# Patient Record
Sex: Male | Born: 1995 | Race: Black or African American | Hispanic: No | Marital: Single | Smoking: Former smoker
Health system: Southern US, Community
[De-identification: ages and names within clinical notes are randomized; demographics above are authoritative.]

## PROBLEM LIST (undated history)

## (undated) DIAGNOSIS — I319 Disease of pericardium, unspecified: Secondary | ICD-10-CM

## (undated) DIAGNOSIS — I10 Essential (primary) hypertension: Secondary | ICD-10-CM

## (undated) HISTORY — PX: OTHER SURGICAL HISTORY: SHX169

## (undated) HISTORY — DX: Essential (primary) hypertension: I10

## (undated) HISTORY — DX: Disease of pericardium, unspecified: I31.9

---
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
## 2013-03-18 HISTORY — PX: WISDOM TOOTH EXTRACTION: SHX21

## 2015-03-06 ENCOUNTER — Emergency Department (HOSPITAL_COMMUNITY)

## 2015-03-06 ENCOUNTER — Emergency Department (HOSPITAL_COMMUNITY)
Admission: EM | Admit: 2015-03-06 | Discharge: 2015-03-07 | Disposition: A | Discharge status: Home / Self Care | Attending: Emergency Medicine | Admitting: Emergency Medicine

## 2015-03-06 ENCOUNTER — Encounter (HOSPITAL_COMMUNITY): Payer: Self-pay | Admitting: Emergency Medicine

## 2015-03-06 DIAGNOSIS — I10 Essential (primary) hypertension: Secondary | ICD-10-CM | POA: Insufficient documentation

## 2015-03-06 DIAGNOSIS — Z87891 Personal history of nicotine dependence: Secondary | ICD-10-CM | POA: Diagnosis not present

## 2015-03-06 DIAGNOSIS — R079 Chest pain, unspecified: Secondary | ICD-10-CM | POA: Diagnosis present

## 2015-03-06 DIAGNOSIS — R0789 Other chest pain: Secondary | ICD-10-CM

## 2015-03-06 LAB — COMPREHENSIVE METABOLIC PANEL
ALT: 36 U/L (ref 17–63)
AST: 29 U/L (ref 15–41)
Albumin: 4.7 g/dL (ref 3.5–5.0)
Alkaline Phosphatase: 73 U/L (ref 38–126)
Anion gap: 11 (ref 5–15)
BUN: 15 mg/dL (ref 6–20)
CO2: 23 mmol/L (ref 22–32)
Calcium: 9.3 mg/dL (ref 8.9–10.3)
Chloride: 105 mmol/L (ref 101–111)
Creatinine, Ser: 1.21 mg/dL (ref 0.61–1.24)
GFR calc Af Amer: >60 mL/min (ref >60)
GFR calc non Af Amer: >60 mL/min (ref >60)
Glucose, Bld: 115 mg/dL — ABNORMAL HIGH (ref 65–99)
Potassium: 3.9 mmol/L (ref 3.5–5.1)
Sodium: 139 mmol/L (ref 135–145)
Total Bilirubin: 0.5 mg/dL (ref 0.3–1.2)
Total Protein: 7.5 g/dL (ref 6.5–8.1)

## 2015-03-06 LAB — CBC
HCT: 43.1 % (ref 39.0–52.0)
Hemoglobin: 14.2 g/dL (ref 13.0–17.0)
MCH: 27.2 pg (ref 26.0–34.0)
MCHC: 32.9 g/dL (ref 30.0–36.0)
MCV: 82.4 fL (ref 78.0–100.0)
Platelets: 257 10*3/uL (ref 150–400)
RBC: 5.23 MIL/uL (ref 4.22–5.81)
RDW: 13 % (ref 11.5–15.5)
WBC: 7.3 10*3/uL (ref 4.0–10.5)

## 2015-03-06 LAB — I-STAT TROPONIN, ED: Troponin i, poc: 0 ng/mL (ref 0.00–0.08)

## 2015-03-06 MED ORDER — ASPIRIN 81 MG PO CHEW
324.0000 mg | CHEWABLE_TABLET | Freq: Once | ORAL | Status: AC
  Administered 2015-03-07: 324 mg via ORAL
  Filled 2015-03-06: qty 4

## 2015-03-06 NOTE — ED Notes (Signed)
Patient is complaining of chest Pain. Patient has had it since Sunday. Patient states it is coming and going.

## 2015-03-07 LAB — I-STAT TROPONIN, ED: Troponin i, poc: 0 ng/mL (ref 0.00–0.08)

## 2015-03-07 NOTE — Discharge Instructions (Signed)
We saw you in the ER for the chest pain/shortness of breath. All of our cardiac workup is normal, including labs, EKG and chest X-RAY are normal. We are not sure what is causing your discomfort, but we feel comfortable sending you home at this time. The workup in the ER is not complete, and you should follow up with your primary care doctor for further evaluation.     Nonspecific Chest Pain  Chest pain can be caused by many different conditions. There is always a chance that your pain could be related to something serious, such as a heart attack or a blood clot in your lungs. Chest pain can also be caused by conditions that are not life-threatening. If you have chest pain, it is very important to follow up with your health care provider. CAUSES  Chest pain can be caused by:  Heartburn.  Pneumonia or bronchitis.  Anxiety or stress.  Inflammation around your heart (pericarditis) or lung (pleuritis or pleurisy).  A blood clot in your lung.  A collapsed lung (pneumothorax). It can develop suddenly on its own (spontaneous pneumothorax) or from trauma to the chest.  Shingles infection (varicella-zoster virus).  Heart attack.  Damage to the bones, muscles, and cartilage that make up your chest wall. This can include:  Bruised bones due to injury.  Strained muscles or cartilage due to frequent or repeated coughing or overwork.  Fracture to one or more ribs.  Sore cartilage due to inflammation (costochondritis). RISK FACTORS  Risk factors for chest pain may include:  Activities that increase your risk for trauma or injury to your chest.  Respiratory infections or conditions that cause frequent coughing.  Medical conditions or overeating that can cause heartburn.  Heart disease or family history of heart disease.  Conditions or health behaviors that increase your risk of developing a blood clot.  Having had chicken pox (varicella zoster). SIGNS AND SYMPTOMS Chest pain can  feel like:  Burning or tingling on the surface of your chest or deep in your chest.  Crushing, pressure, aching, or squeezing pain.  Dull or sharp pain that is worse when you move, cough, or take a deep breath.  Pain that is also felt in your back, neck, shoulder, or arm, or pain that spreads to any of these areas. Your chest pain may come and go, or it may stay constant. DIAGNOSIS Lab tests or other studies may be needed to find the cause of your pain. Your health care provider may have you take a test called an ambulatory ECG (electrocardiogram). An ECG records your heartbeat patterns at the time the test is performed. You may also have other tests, such as:  Transthoracic echocardiogram (TTE). During echocardiography, sound waves are used to create a picture of all of the heart structures and to look at how blood flows through your heart.  Transesophageal echocardiogram (TEE).This is a more advanced imaging test that obtains images from inside your body. It allows your health care provider to see your heart in finer detail.  Cardiac monitoring. This allows your health care provider to monitor your heart rate and rhythm in real time.  Holter monitor. This is a portable device that records your heartbeat and can help to diagnose abnormal heartbeats. It allows your health care provider to track your heart activity for several days, if needed.  Stress tests. These can be done through exercise or by taking medicine that makes your heart beat more quickly.  Blood tests.  Imaging tests. TREATMENT  Your treatment depends on what is causing your chest pain. Treatment may include:  Medicines. These may include:  Acid blockers for heartburn.  Anti-inflammatory medicine.  Pain medicine for inflammatory conditions.  Antibiotic medicine, if an infection is present.  Medicines to dissolve blood clots.  Medicines to treat coronary artery disease.  Supportive care for conditions that do  not require medicines. This may include:  Resting.  Applying heat or cold packs to injured areas.  Limiting activities until pain decreases. HOME CARE INSTRUCTIONS  If you were prescribed an antibiotic medicine, finish it all even if you start to feel better.  Avoid any activities that bring on chest pain.  Do not use any tobacco products, including cigarettes, chewing tobacco, or electronic cigarettes. If you need help quitting, ask your health care provider.  Do not drink alcohol.  Take medicines only as directed by your health care provider.  Keep all follow-up visits as directed by your health care provider. This is important. This includes any further testing if your chest pain does not go away.  If heartburn is the cause for your chest pain, you may be told to keep your head raised (elevated) while sleeping. This reduces the chance that acid will go from your stomach into your esophagus.  Make lifestyle changes as directed by your health care provider. These may include:  Getting regular exercise. Ask your health care provider to suggest some activities that are safe for you.  Eating a heart-healthy diet. A registered dietitian can help you to learn healthy eating options.  Maintaining a healthy weight.  Managing diabetes, if necessary.  Reducing stress. SEEK MEDICAL CARE IF:  Your chest pain does not go away after treatment.  You have a rash with blisters on your chest.  You have a fever. SEEK IMMEDIATE MEDICAL CARE IF:   Your chest pain is worse.  You have an increasing cough, or you cough up blood.  You have severe abdominal pain.  You have severe weakness.  You faint.  You have chills.  You have sudden, unexplained chest discomfort.  You have sudden, unexplained discomfort in your arms, back, neck, or jaw.  You have shortness of breath at any time.  You suddenly start to sweat, or your skin gets clammy.  You feel nauseous or you vomit.  You  suddenly feel light-headed or dizzy.  Your heart begins to beat quickly, or it feels like it is skipping beats. These symptoms may represent a serious problem that is an emergency. Do not wait to see if the symptoms will go away. Get medical help right away. Call your local emergency services (911 in the [place] Do not drive yourself to the hospital.   This information is not intended to replace advice given to you by your health care provider. Make sure you discuss any questions you have with your health care provider.   Document Released: 10/14/2004 Document Revised: 01/25/2014 Document Reviewed: 08/10/2013 Elsevier Interactive Patient Education 2016 [employer]    Pericarditis Pericarditis is swelling (inflammation) of the pericardium. The pericardium is a thin, double-layered, fluid-filled tissue sac that surrounds the heart. The purpose of the pericardium is to contain the heart in the chest cavity and keep the heart from overexpanding. Different types of pericarditis can occur, such as:  Acute pericarditis. Inflammation can develop suddenly in acute pericarditis.  Chronic pericarditis. Inflammation develops gradually and is long-lasting in chronic pericarditis.  Constrictive pericarditis. In this type of pericarditis, the layers of the pericardium stiffen and  develop scar tissue. The scar tissue thickens and sticks together. This makes it difficult for the heart to pump and work as it normally does. CAUSES  Pericarditis can be caused from different conditions, such as:  A bacterial, fungal or viral infection.  After a heart attack (myocardial infarction).  After open-heart surgery (coronary bypass graft surgery).  Auto-immune conditions such as lupus, rheumatoid arthritis or scleroderma.  Kidney failure.  Low thyroid condition (hypothyroidism).  Cancer from another part of the body that has spread (metastasized) to the pericardium.  Chest injury or trauma.  After  radiation treatment.  Certain medicines. SYMPTOMS  Symptoms of pericarditis can include:  Chest pain. Chest pain symptoms may increase when laying down and may be relieved when sitting up and leaning forward.  A chronic, dry cough.  Heart palpitations. These may feel like rapid, fluttering or pounding heart beats.  Chest pain may be worse when swallowing.  Dizziness or fainting.  Tiredness, fatigue or lethargy.  Fever. DIAGNOSIS  Pericarditis is diagnosed by the following:  A physical exam. A heart sound called a pericardial friction rub may be heard when your caregiver listens to your heart.  Blood work. Blood may be drawn to check for an infection and to look at your blood chemistry.  Electrocardiography. During electrocardiography your heart's electrical activity is monitored and recorded with a tracing on paper (electrocardiogram [ECG]).  Echocardiography.  Computed tomography (CT).  Magnetic resonance image (MRI). TREATMENT  To treat pericarditis, it is important to know the cause of it. The cause of pericarditis determines the treatment.   If the cause of pericarditis is due to an infection, treatment is based on the type of infection. If an infection is suspected in the pericardial fluid, a procedure called a pericardial fluid culture and biopsy may be done. This takes a sample of the pericardial fluid. The sample is sent to a lab which runs tests on the pericardial fluid to check for an infection.  If the autoimmune disease is the cause, treatment of the autoimmune condition will help improve the pericarditis.  If the cause of pericarditis is not known, anti-inflammatory medicines may be used to help decrease the inflammation.  Surgery may be needed. The following are types of surgeries or procedures that may be done to treat pericarditis:  Pericardial window. A pericardial window makes a cut (incision) into the pericardial sac. This allows excess fluid in the  pericardium to drain.  Pericardiocentesis. A pericardiocentesis is also known as a pericardial tap. This procedure uses a needle that is guided by X-ray to drain (aspirate) excess fluid from the pericardium.  Pericardiectomy. A pericardiectomy removes part or all of the pericardium. HOME CARE INSTRUCTIONS   Do not smoke. If you smoke, quit. Your caregiver can help you quit smoking.  Maintain a healthy weight.  Follow an exercise program as directed by your health care provider. You may need to limit your exercising until your symptoms go away.  If you drink alcohol, do so in moderation.  Eat a heart healthy diet. A registered dietitian can help you learn about healthy food choices.  Keep a list of all your medicines with you at all times. Include the name, dose, how often it is taken and how it is taken. SEEK IMMEDIATE MEDICAL CARE IF:   You have chest pain or feelings of chest pressure.  You have sweating (diaphoresis) when at rest.  You have irregular heartbeats (palpitations).  You have rapid, racing heart beats.  You have unexplained  fainting episodes.  You feel sick to your stomach (nausea) or vomiting without cause.  You have unexplained weakness. If you develop any of the symptoms which originally made you seek care, call for local emergency medical help. Do not drive yourself to the hospital.   This information is not intended to replace advice given to you by your health care provider. Make sure you discuss any questions you have with your health care provider.   Document Released: 06/30/2000 Document Revised: 05/21/2014 Document Reviewed: 07/17/2014 Elsevier Interactive Patient Education [employer]

## 2015-03-07 NOTE — ED Provider Notes (Signed)
CSN: [id number]     Arrival date & time 03/06/15  2232 History   First MD Initiated Contact with Patient 03/06/15 2250     Chief Complaint  Patient presents with  . Chest Pain     (Consider location/radiation/quality/duration/timing/severity/associated sxs/prior Treatment) HPI Comments: PT comes in with cc of chest pain. Pt has been having chest pain since Sunday. The pain is constant since then with intermittent flare up. Pt has no associated n/v/f/c/dib/diophoresis. Pt denies drug use, smoking cigarettes and there is no premature CAD in the family. Pain is not positional.    Patient is a 19 y.o. male presenting with chest pain. The history is provided by the patient.  Chest Pain Associated symptoms: no abdominal pain, no cough and no shortness of breath     History reviewed. No pertinent past medical history. History reviewed. No pertinent past surgical history. History reviewed. No pertinent family history. Social History  Substance Use Topics  . Smoking status: Former Games developer  . Smokeless tobacco: Never Used  . Alcohol Use: Yes    Review of Systems  Constitutional: Negative for activity change and appetite change.  Respiratory: Negative for cough and shortness of breath.   Cardiovascular: Positive for chest pain.  Gastrointestinal: Negative for abdominal pain.  Genitourinary: Negative for dysuria.      Allergies  Shellfish allergy and Iodine  Home Medications   Prior to Admission medications   Medication Sig Start Date End Date Taking? Authorizing Provider  ibuprofen (ADVIL,MOTRIN) 200 MG tablet Take 400 mg by mouth every 6 (six) hours as needed for headache, mild pain or moderate pain.   Yes Historical Provider, MD  Melatonin 5 MG CAPS Take 1 capsule by mouth at bedtime as needed (sleep). sleep   Yes Historical Provider, MD   BP 148/110 mmHg  Pulse 90  Temp(Src) 98.4 F (36.9 C) (Oral)  Resp 20  Ht  (1.778 m)  Wt 184 lb (83.462 kg)  BMI 26.40 kg/m2   SpO2 99% Physical Exam  Constitutional: He is oriented to person, place, and time. He appears well-developed.  HENT:  Head: Atraumatic.  Neck: Neck supple.  Cardiovascular: Normal rate.   Pulmonary/Chest: Effort normal. No respiratory distress.  Abdominal: There is no tenderness.  Neurological: He is alert and oriented to person, place, and time.  Skin: Skin is warm.  Nursing note and vitals reviewed.   ED Course  Procedures (including critical care time) Labs Review Labs Reviewed  COMPREHENSIVE METABOLIC PANEL - Abnormal; Notable for the following:    Glucose, Bld 115 (*)    All other components within normal limits  CBC  I-STAT TROPOININ, ED    Imaging Review Dg Chest 2 View  03/06/2015  CLINICAL DATA:  20 year old male with mid chest pain and shortness of breath EXAM: CHEST  2 VIEW COMPARISON:  None. FINDINGS: The lungs are clear and negative for focal airspace consolidation, pulmonary edema or suspicious pulmonary nodule. No pleural effusion or pneumothorax. Cardiac and mediastinal contours are within normal limits. No acute fracture or lytic or blastic osseous lesions. The visualized upper abdominal bowel gas pattern is unremarkable. IMPRESSION: Normal chest x-ray. Electronically Signed   By: Malachy Moan M.D.   On: 03/06/2015 23:27   I have personally reviewed and evaluated these images and lab results as part of my medical decision-making.   EKG Interpretation   Date/Time:  Thursday March 06 2015 22:55:25 EST Ventricular Rate:  93 PR Interval:  154 QRS Duration: 116 QT Interval:  356 QTC Calculation: 443 R Axis:   65 Text Interpretation:  Sinus rhythm Probable left atrial enlargement  Incomplete right bundle branch block Extensive anterior infarct, acute  Diffuse ST elevation - likely early repo. V2 ST elevation looks a bit  concerning No reciprocal depressions No pathological q waves Confirmed by  Rhunette Croft, MD, Eavan Gonterman [phone]) on 03/06/2015 11:01:17 PM       EKG Interpretation  Date/Time:  Thursday March 06 2015 22:55:25 EST Ventricular Rate:  93 PR Interval:  154 QRS Duration: 116 QT Interval:  356 QTC Calculation: 443 R Axis:   65 Text Interpretation:  Sinus rhythm Probable left atrial enlargement Incomplete right bundle branch block Extensive anterior infarct, acute Diffuse ST elevation - likely early repo. V2 ST elevation looks a bit concerning No reciprocal depressions No pathological q waves Confirmed by Genisis Sonnier, MD, Janey Genta [phone]) on 03/06/2015 11:01:17 PM        MDM   Final diagnoses:  Pericarditis  Essential hypertension    Differential diagnosis includes: ACS syndrome Myocarditis Pericarditis Pericardial effusion Pneumonia Pleural effusion Pulmonary edema PE  Musculoskeletal pain  Pt comes in with cc of chest pain. Diffuse anterior ST elevation, V2 is the only lead that appears a bit concerning, but no reciprocal depressions. Chest pain is atypical as well. Pericarditis is considered, but clinically doesn't appear to be one. Trops x 2 - if neg will d/c  Pt's BP also elevated, will advise PCP follow up.   Derwood Kaplan, MD 03/07/15 [phone]

## 2015-03-07 NOTE — ED Notes (Signed)
Patient d/c'd self care.  F/U discussed, patient verbalized understanding. 

## 2015-03-25 DIAGNOSIS — I10 Essential (primary) hypertension: Secondary | ICD-10-CM | POA: Insufficient documentation

## 2015-03-25 DIAGNOSIS — I319 Disease of pericardium, unspecified: Secondary | ICD-10-CM | POA: Insufficient documentation

## 2015-03-28 ENCOUNTER — Ambulatory Visit (INDEPENDENT_AMBULATORY_CARE_PROVIDER_SITE_OTHER): Payer: Self-pay | Admitting: Cardiology

## 2015-03-28 ENCOUNTER — Encounter: Payer: Self-pay | Admitting: Cardiology

## 2015-03-28 VITALS — BP 134/100 | HR 94 | Ht 70.0 in | Wt 193.4 lb

## 2015-03-28 DIAGNOSIS — I10 Essential (primary) hypertension: Secondary | ICD-10-CM

## 2015-03-28 MED ORDER — IBUPROFEN 800 MG PO TABS
ORAL_TABLET | ORAL | Status: DC
Start: 2015-03-28 — End: 2015-04-18

## 2015-03-28 NOTE — Patient Instructions (Addendum)
Dr Antoine PocheHochrein has recommended making the following medication changes: TAKE Ibuprofen 800 mg - take 1 tablet by mouth twice daily for 7-10 days  Please keep a record of your blood pressure on the log sheet provided.  Dr Antoine PocheHochrein recommends that you schedule a follow-up appointment in 2 weeks. PLEASE BRING YOU BLOOD PRESSURE LOG WITH YOU TO THIS APPOINTMENT.

## 2015-03-28 NOTE — Progress Notes (Signed)
Cardiology Office Note   Date:  03/28/2015   ID:  Dan Chase, DOB 1995/10/12, MRN [id number]  PCP:  PROVIDER NOT IN SYSTEM  Cardiologist:   Rollene Rotunda, MD   No chief complaint on file.     History of Present Illness: Dan Chase is a 20 y.o. male who presents for evaluation of chest pain and abnormal EKG. He was in the emergency room on 2/17 after about 2 weeks of chest discomfort.  This was some intermittent sharp discomfort. Periodically 8 out of 10 in intensity and somewhat positional being worse than he was lying on his stomach. He is also having some more persistent dull discomfort that was 3 out of 10 in intensity. He was not having associated nausea vomiting or diaphoresis. He has not had this kind of pain before. His prosthesis chest but not into his neck or to his arms. He has not had palpitations, presyncope or syncope. He has not had any PND or orthopnea. She went to the emergency room and I reviewed these records. His thought maybe have pericarditis although his EKG was most consistent with an early repolarization pattern. He presents today. His EKG is unchanged from previous. Have some mild persistent discomfort but did go away for about a week in between. 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  Past Medical History  Diagnosis Date  . HTN (hypertension)   . Pericarditis     Possible    Past Surgical History  Procedure Laterality Date  . None       Current Outpatient Prescriptions  Medication Sig Dispense Refill  . ibuprofen (ADVIL,MOTRIN) 200 MG tablet Take 400 mg by mouth every 6 (six) hours as needed for headache, mild pain or moderate pain.    . Melatonin 5 MG CAPS Take 1 capsule by mouth at bedtime as needed (sleep). sleep     No current facility-administered medications for this visit.    Allergies:   Shellfish allergy and Iodine    Social History:  The patient  reports that he has quit  smoking. He has never used smokeless tobacco. He reports that he drinks alcohol. He reports that he does not use illicit drugs.   Family History:  The patient's family history includes Stroke (age of onset: 54) in his mother; Evelene Croon Parkinson White syndrome in his sister.    ROS:  Please see the history of present illness.   Otherwise, review of systems are positive for none.   All other systems are reviewed and negative.    PHYSICAL EXAM: VS:  BP 134/100 mmHg  Pulse 94  Ht  (1.778 m)  Wt 193 lb 6 oz (87.714 kg)  BMI 27.75 kg/m2 , BMI Body mass index is 27.75 kg/(m^2). GENERAL:  Well appearing HEENT:  Pupils equal round and reactive, fundi not visualized, oral mucosa unremarkable NECK:  No jugular venous distention, waveform within normal limits, carotid upstroke brisk and symmetric, no bruits, no thyromegaly LYMPHATICS:  No cervical, inguinal adenopathy LUNGS:  Clear to auscultation bilaterally BACK:  No CVA tenderness CHEST:  Unremarkable HEART:  PMI not displaced or sustained,S1 and S2 within normal limits, no S3, no S4, no clicks, no rubs, no murmurs ABD:  Flat, positive bowel sounds normal in frequency in pitch, no bruits, no rebound, no guarding, no midline pulsatile mass, no hepatomegaly, no splenomegaly EXT:  2 plus pulses throughout, no edema, no cyanosis no clubbing SKIN:  No  rashes no nodules NEURO:  Cranial nerves II through XII grossly intact, motor grossly intact throughout PSYCH:  Cognitively intact, oriented to person place and time    EKG:  EKG is ordered today.  The ekg ordered today demonstrates Sinus rhythm, rate 95, axis within normal limits, intervals within normal normal limits, early repolarization pattern.   Recent Labs: 03/06/2015: ALT 36; BUN 15; Creatinine, Ser 1.21; Hemoglobin 14.2; Platelets 257; Potassium 3.9; Sodium 139    Lipid Panel No results found for: CHOL, TRIG, HDL, CHOLHDL, VLDL, LDLCALC, LDLDIRECT    Wt Readings from Last 3  Encounters:  03/28/15 193 lb 6 oz (87.714 kg)  03/06/15 184 lb (83.462 kg)      Other studies Reviewed: Additional studies/ records that were reviewed today include: ED records. Review of the above records demonstrates:  Please see elsewhere in the note.     ASSESSMENT AND PLAN:  Chest pain: I think this is somewhat atypical. It may represent pericarditis although I think this is somewhat unlikely. His EKG is early repolarization pattern. Regardless she's having ongoing pain did not take his Motrin I will give him 800 mg twice a day for the next 7-10 days.  HTN:  He's going blood pressure diary for 2 weeks. He will need therapy most likely. We discussed low salt.     Current medicines are reviewed at length with the patient today.  The patient does not have concerns regarding medicines.  The following changes have been made:  no change  Labs/ tests ordered today include:  No orders of the defined types were placed in this encounter.     Disposition:   FU with me in two weeks.     Signed, Rollene RotundaJames Arrian Manson, MD  03/28/2015 4:59 PM    [facility]

## 2015-04-08 ENCOUNTER — Encounter: Payer: Self-pay | Admitting: Cardiology

## 2015-04-18 ENCOUNTER — Ambulatory Visit (INDEPENDENT_AMBULATORY_CARE_PROVIDER_SITE_OTHER): Payer: PRIVATE HEALTH INSURANCE | Admitting: Cardiology

## 2015-04-18 ENCOUNTER — Encounter: Payer: Self-pay | Admitting: Cardiology

## 2015-04-18 VITALS — BP 146/100 | HR 88 | Ht 70.0 in | Wt 192.0 lb

## 2015-04-18 DIAGNOSIS — I10 Essential (primary) hypertension: Secondary | ICD-10-CM

## 2015-04-18 MED ORDER — CHLORTHALIDONE 25 MG PO TABS: 25.0000 mg | ORAL_TABLET | Freq: Every day | ORAL | Status: DC

## 2015-04-18 MED ORDER — CHLORTHALIDONE 25 MG PO TABS: 25.0000 mg | ORAL_TABLET | Freq: Every day | ORAL | Status: AC

## 2015-04-18 NOTE — Patient Instructions (Addendum)
Your physician recommends that you schedule a follow-up appointment in: 3 Weeks  Your physician has recommended you make the following change in your medication: START Chlorthalidone 25 mg daily

## 2015-04-18 NOTE — Progress Notes (Signed)
Cardiology Office Note   Date:  04/18/2015   ID:  Dan Chase, DOB 02/04/1995, MRN [id number]  PCP:  PROVIDER NOT IN SYSTEM  Cardiologist:   Rollene RotundaJames Earnesteen Birnie, MD   Chief Complaint  Patient presents with  . 2 WEEK FOLLOW UP    NO COMPLAINTS.      History of Present Illness: Dan Chase is a 20 y.o. male who presents for evaluation of chest pain and abnormal EKG. This is his second visit. He had an abnormal EKG he thought maybe to represent pericarditis but most likely consistent with repolarization changes. At the last visit he was having some mild intermittent discomfort and I prescribed a course of Motrin. However, he didn't take this and his symptoms completely resolved. He was hypertensive he returns today with a blood pressure diary. He continues to be hypertensive with readings of 150 his over 1 teens. He's not having any further chest pain and denies any chest pressure, neck or arm discomfort. He said no shortness of breath, PND or orthopnea. He's had no palpitations, presyncope or syncope. He has had no weight gain or edema. He remains active walking around campus.  Past Medical History  Diagnosis Date  . HTN (hypertension)   . Pericarditis     Possible    Past Surgical History  Procedure Laterality Date  . None    . Wisdom tooth extraction  03/2013     Current Outpatient Prescriptions  Medication Sig Dispense Refill  . ibuprofen (ADVIL,MOTRIN) 200 MG tablet Take 400 mg by mouth every 6 (six) hours as needed for headache, mild pain or moderate pain.    . Melatonin 5 MG CAPS Take 1 capsule by mouth at bedtime as needed (sleep). sleep     No current facility-administered medications for this visit.    Allergies:   Shellfish allergy and Iodine    ROS:  Please see the history of present illness.   Otherwise, review of systems are positive for none.   All other systems are reviewed and negative.    PHYSICAL EXAM: VS:  BP 146/100 mmHg  Pulse 88  Ht 5\' 10"  (1.778  m)  Wt 192 lb (87.091 kg)  BMI 27.55 kg/m2 , BMI Body mass index is 27.55 kg/(m^2). GENERAL:  Well appearing HEENT:  Pupils equal round and reactive, fundi not visualized, oral mucosa unremarkable NECK:  No jugular venous distention, waveform within normal limits, carotid upstroke brisk and symmetric, no bruits, no thyromegaly LUNGS:  Clear to auscultation bilaterally CHEST:  Unremarkable HEART:  PMI not displaced or sustained,S1 and S2 within normal limits, no S3, no S4, no clicks, no rubs, no murmurs ABD:  Flat, positive bowel sounds normal in frequency in pitch, no bruits, no rebound, no guarding, no midline pulsatile mass, no hepatomegaly, no splenomegaly EXT:  2 plus pulses throughout, no edema, no cyanosis no clubbing   EKG:  EKG is ordered today.  The ekg ordered today demonstrates Sinus rhythm, rate 83, axis within normal limits, intervals within normal normal limits, early repolarization pattern.  Unchanged from previous.   Recent Labs: 03/06/2015: ALT 36; BUN 15; Creatinine, Ser 1.21; Hemoglobin 14.2; Platelets 257; Potassium 3.9; Sodium 139    Lipid Panel No results found for: CHOL, TRIG, HDL, CHOLHDL, VLDL, LDLCALC, LDLDIRECT    Wt Readings from Last 3 Encounters:  04/18/15 192 lb (87.091 kg)  03/28/15 193 lb 6 oz (87.714 kg)  03/06/15 184 lb (83.462 kg)      Other studies Reviewed: Additional  studies/ records that were reviewed today include: BP diary Review of the above records demonstrates:  Please see elsewhere in the note.     ASSESSMENT AND PLAN:  Chest pain: this has resolved. No change in therapy is indicated.  HTN:  His blood pressure remains elevated.  I will start chlorthalidone. He will continue his blood pressure diary. I see him back in a couple weeks and med titration.  He was told to increase his potassium containing foods.  ABNORMAL EKG:  This remains unchanged and seems to represent early repolarization. He has no abnormal physical findings. I  will consider followup echocardiography.   Current medicines are reviewed at length with the patient today.  The patient does not have concerns regarding medicines.  The following changes have been made:  no change  Labs/ tests ordered today include:  No orders of the defined types were placed in this encounter.     Disposition:   FU with me in 4 weeks.     Signed, Rollene Rotunda, MD  04/18/2015 4:37 PM    [facility]

## 2015-04-24 NOTE — Addendum Note (Signed)
Addended by: Evans LanceSTOVER, Layah Skousen W on: 04/24/2015 04:34 PM   Modules accepted: Orders

## 2015-05-15 ENCOUNTER — Ambulatory Visit: Payer: PRIVATE HEALTH INSURANCE | Admitting: Cardiology

## 2017-01-06 IMAGING — CR DG CHEST 2V
2 series · 2 of 2 positions shown · non-contrast
Comparison: None.

CLINICAL DATA: 19-year-old male with mid chest pain and shortness
of breath

EXAM:
CHEST  2 VIEW

[w chest pa]
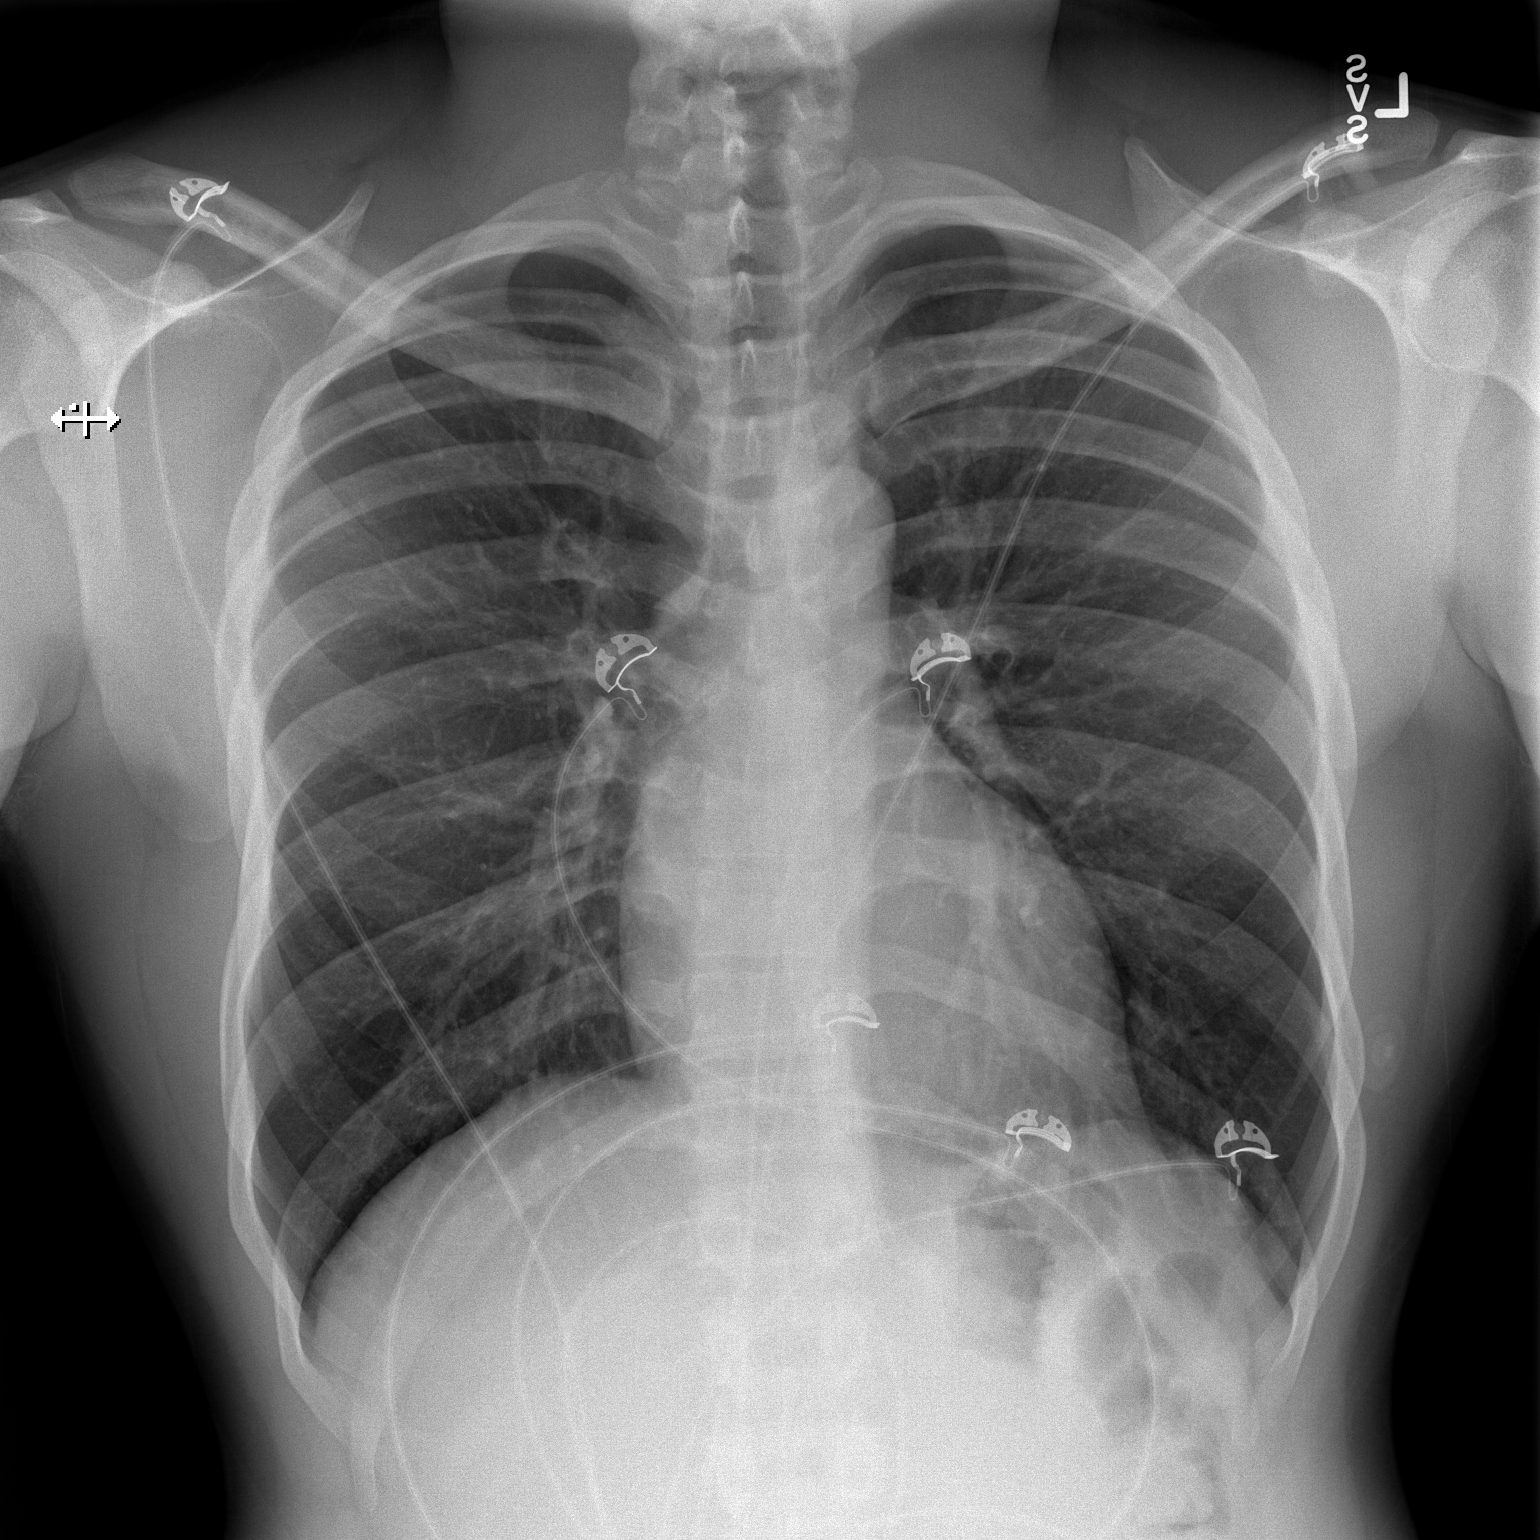

[w chest lat]
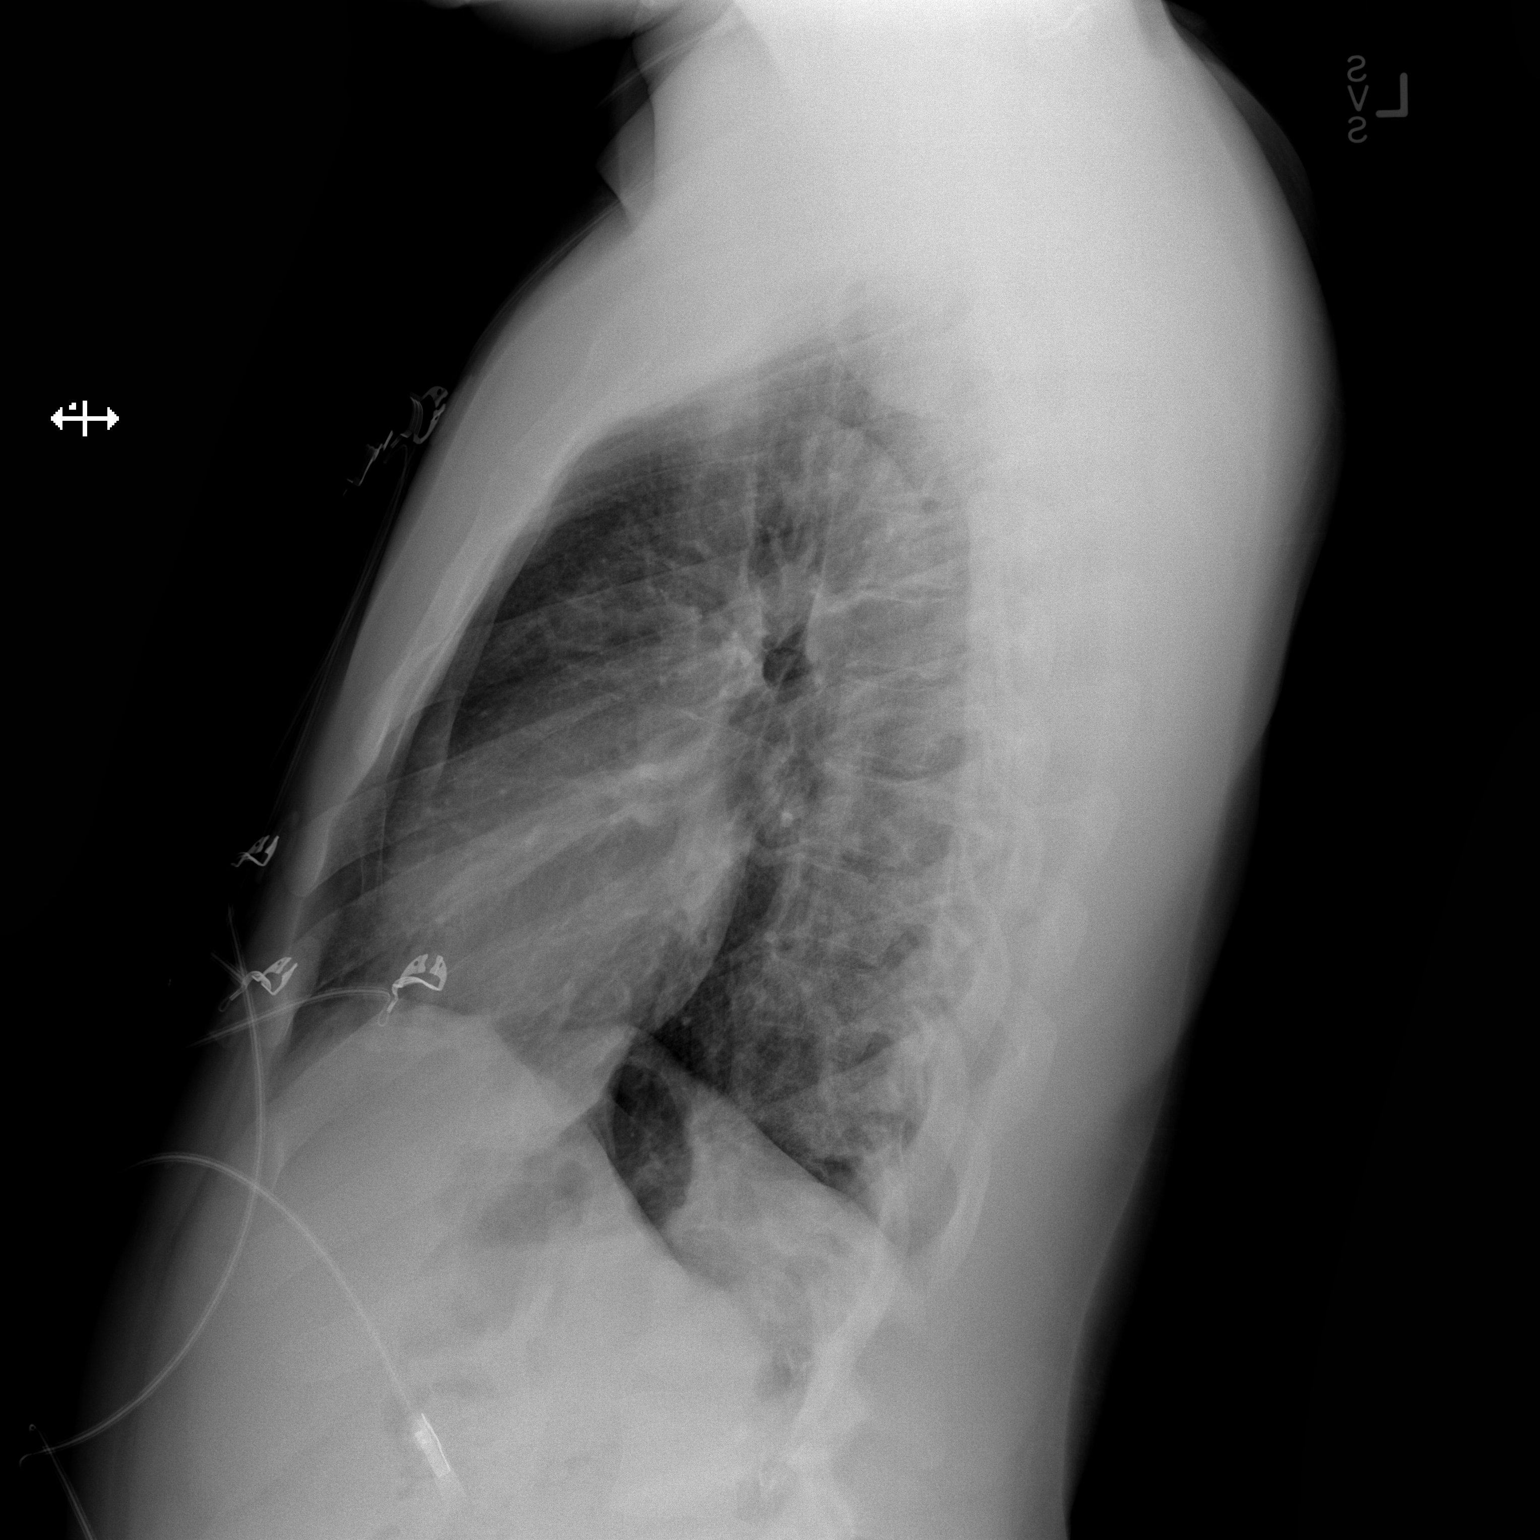

[2 of 2 positions shown; findings below may reference images not displayed]

FINDINGS: The lungs are clear and negative for focal airspace consolidation,
pulmonary edema or suspicious pulmonary nodule. No pleural effusion
or pneumothorax. Cardiac and mediastinal contours are within normal
limits. No acute fracture or lytic or blastic osseous lesions. The
visualized upper abdominal bowel gas pattern is unremarkable.
IMPRESSION: Normal chest x-ray.

## 2018-02-07 DIAGNOSIS — R42 Dizziness and giddiness: Secondary | ICD-10-CM | POA: Diagnosis present

## 2018-02-07 DIAGNOSIS — Z87891 Personal history of nicotine dependence: Secondary | ICD-10-CM | POA: Insufficient documentation

## 2018-02-07 DIAGNOSIS — I10 Essential (primary) hypertension: Secondary | ICD-10-CM | POA: Diagnosis not present

## 2018-02-07 DIAGNOSIS — Z79899 Other long term (current) drug therapy: Secondary | ICD-10-CM | POA: Insufficient documentation

## 2018-02-08 ENCOUNTER — Other Ambulatory Visit: Payer: Self-pay

## 2018-02-08 ENCOUNTER — Encounter (HOSPITAL_COMMUNITY): Payer: Self-pay | Admitting: *Deleted

## 2018-02-08 ENCOUNTER — Emergency Department (HOSPITAL_COMMUNITY)
Admission: EM | Admit: 2018-02-08 | Discharge: 2018-02-08 | Disposition: A | Discharge status: Home / Self Care | Attending: Emergency Medicine | Admitting: Emergency Medicine

## 2018-02-08 DIAGNOSIS — R42 Dizziness and giddiness: Secondary | ICD-10-CM

## 2018-02-08 LAB — URINALYSIS, ROUTINE W REFLEX MICROSCOPIC
Bacteria, UA: NONE SEEN
Bilirubin Urine: NEGATIVE
Glucose, UA: NEGATIVE mg/dL
Ketones, ur: NEGATIVE mg/dL
Leukocytes, UA: NEGATIVE
Nitrite: NEGATIVE
Protein, ur: NEGATIVE mg/dL
Specific Gravity, Urine: 1.005 (ref 1.005–1.030)
pH: 7 (ref 5.0–8.0)

## 2018-02-08 LAB — CBC
HCT: 45.1 % (ref 39.0–52.0)
Hemoglobin: 14.7 g/dL (ref 13.0–17.0)
MCH: 27.1 pg (ref 26.0–34.0)
MCHC: 32.6 g/dL (ref 30.0–36.0)
MCV: 83.1 fL (ref 80.0–100.0)
Platelets: 284 10*3/uL (ref 150–400)
RBC: 5.43 MIL/uL (ref 4.22–5.81)
RDW: 13.4 % (ref 11.5–15.5)
WBC: 8.7 10*3/uL (ref 4.0–10.5)
nRBC: 0 % (ref 0.0–0.2)

## 2018-02-08 LAB — BASIC METABOLIC PANEL
Anion gap: 10 (ref 5–15)
BUN: 13 mg/dL (ref 6–20)
CO2: 28 mmol/L (ref 22–32)
Calcium: 9.6 mg/dL (ref 8.9–10.3)
Chloride: 99 mmol/L (ref 98–111)
Creatinine, Ser: 1.11 mg/dL (ref 0.61–1.24)
GFR calc Af Amer: >60 mL/min (ref >60)
GFR calc non Af Amer: >60 mL/min (ref >60)
Glucose, Bld: 100 mg/dL — ABNORMAL HIGH (ref 70–99)
Potassium: 3.3 mmol/L — ABNORMAL LOW (ref 3.5–5.1)
Sodium: 137 mmol/L (ref 135–145)

## 2018-02-08 LAB — CBG MONITORING, ED: Glucose-Capillary: 87 mg/dL (ref 70–99)

## 2018-02-08 NOTE — ED Triage Notes (Signed)
Pt c/o dizziness, nausea x 1 hour & hypertensive.

## 2018-02-08 NOTE — ED Provider Notes (Signed)
[facility]-EMERGENCY DEPT Provider Note   CSN: [id number] Arrival date & time: 02/07/18  2332     History   Chief Complaint Chief Complaint  Patient presents with  . Dizziness  . Nausea    HPI Dan Chase is a 23 y.o. male.  HPI 23 year old male with history of hypertension and possible pericarditis comes in with chief complaint of dizziness. Patient reports that he was watching YouTube videos when suddenly he started having dizziness described as feeling like he was in" waves".  With that he had associated nausea.  Patient checked his blood pressure and it was elevated and his pulse rate was normal.  In total the symptoms lasted for about hour and a half.  Currently the symptoms have resolved.  He thinks that his symptoms were worse with movement of his head.  He denies any clear spinning sensation.  There was no associated diaphoresis, palpitations, near syncope.  Patient has had similar symptoms in the past and they were self-limiting.  Patient denies any substance abuse, heavy smoking or alcohol abuse.  There is some family history of cardiac disease in young people, patient was seen by cardiologist 3 years back for nonspecific ST changes and his work-up at that time was negative.  Past Medical History:  Diagnosis Date  . HTN (hypertension)   . Pericarditis    Possible    Patient Active Problem List   Diagnosis Date Noted  . HTN (hypertension)   . Pericarditis     Past Surgical History:  Procedure Laterality Date  . None    . WISDOM TOOTH EXTRACTION  03/2013        Home Medications    Prior to Admission medications   Medication Sig Start Date End Date Taking? Authorizing Provider  chlorthalidone (HYGROTON) 25 MG tablet Take 1 tablet (25 mg total) by mouth daily. 04/18/15   Rollene RotundaHochrein, James, MD  ibuprofen (ADVIL,MOTRIN) 200 MG tablet Take 400 mg by mouth every 6 (six) hours as needed for headache, mild pain or moderate pain.    [provider]  Melatonin 5 MG CAPS Take 1 capsule by mouth at bedtime as needed (sleep). sleep    [provider]    Family History Family History  Problem Relation Age of Onset  . Stroke Mother 3637  . Hypertension Maternal Grandmother   . Diabetes Maternal Grandmother   . Evelene CroonWolff Parkinson White syndrome Sister     Social History Social History   Tobacco Use  . Smoking status: Former Games developermoker  . Smokeless tobacco: Never Used  Substance Use Topics  . Alcohol use: Yes  . Drug use: No     Allergies   Shellfish allergy and Iodine   Review of Systems Review of Systems  Constitutional: Positive for activity change.  Respiratory: Negative for shortness of breath.   Cardiovascular: Negative for chest pain.  Gastrointestinal: Positive for nausea.  Neurological: Positive for dizziness.  All other systems reviewed and are negative.    Physical Exam Updated Vital Signs BP (!) 140/99   Pulse 80   Temp 98.2 F (36.8 C) (Oral)   Resp 19   Ht 5\' 10"  (1.778 m)   Wt 89.8 kg   SpO2 99%   BMI 28.41 kg/m   Physical Exam Vitals signs and nursing note reviewed.  Constitutional:      Appearance: He is well-developed.  HENT:     Head: Atraumatic.  Eyes:     Extraocular Movements: Extraocular movements intact.  Pupils: Pupils are equal, round, and reactive to light.     Comments: No nystagmus  Neck:     Musculoskeletal: Neck supple.  Cardiovascular:     Rate and Rhythm: Normal rate.  Pulmonary:     Effort: Pulmonary effort is normal.  Skin:    General: Skin is warm.  Neurological:     General: No focal deficit present.     Mental Status: He is alert and oriented to person, place, and time.     Cranial Nerves: No cranial nerve deficit.      ED Treatments / Results  Labs (all labs ordered are listed, but only abnormal results are displayed) Labs Reviewed  BASIC METABOLIC PANEL - Abnormal; Notable for the following components:      Result Value    Potassium 3.3 (*)    Glucose, Bld 100 (*)    All other components within normal limits  URINALYSIS, ROUTINE W REFLEX MICROSCOPIC - Abnormal; Notable for the following components:   Color, Urine COLORLESS (*)    Hgb urine dipstick SMALL (*)    All other components within normal limits  CBC  CBG MONITORING, ED    EKG EKG Interpretation  Date/Time:  Wednesday February 08 2018 00:55:20 EST Ventricular Rate:  97 PR Interval:    QRS Duration: 126 QT Interval:  375 QTC Calculation: 477 R Axis:   79 Text Interpretation:  Sinus arrhythmia Right bundle branch block Probable anteroseptal infarct, acute No acute changes No significant change since last tracing ST elevation - diffuse Confirmed by Derwood Kaplan [phone]) on 02/08/2018 1:13:25 AM   Radiology No results found.  Procedures Procedures (including critical care time)  Medications Ordered in ED Medications - No data to display   Initial Impression / Assessment and Plan / ED Course  I have reviewed the triage vital signs and the nursing notes.  Pertinent labs & imaging results that were available during my care of the patient were reviewed by me and considered in my medical decision making (see chart for details).     Patient comes in with chief complaint of dizziness. He had associated nausea and his blood pressure was high.  At this time his BP is 140/95 and is asymptomatic.  Neuro exam is nonfocal.  Patient did not have any cardiac prodrome associated with his nonspecific dizziness.  There is also no near syncope, palpitation or focal neurologic deficits.  Social history is benign.  There is no new medication.  It is unclear as to what the underlying cause for his symptoms is.  We monitored patient on telemetry for short while and check basic labs which are all reassuring.  For now we have recommended close follow-up with PCP, and returning to the ER if the symptoms return or get worse.  Final Clinical Impressions(s) / ED  Diagnoses   Final diagnoses:  Dizziness    ED Discharge Orders    None       Derwood Kaplan, MD 02/08/18 [phone]

## 2018-02-08 NOTE — Discharge Instructions (Addendum)
We saw you in the ER for episode of dizziness, nausea and elevated BP. All the results in the ER are normal, labs and imaging.  We are not sure what is causing your symptoms - and by the time we saw you the symptoms have resolved.  The workup in the ER is not complete, and is limited to screening for life threatening and emergent conditions only, so please see a primary care doctor for further evaluation.

## 2018-07-17 ENCOUNTER — Emergency Department (HOSPITAL_COMMUNITY)
Admission: EM | Admit: 2018-07-17 | Discharge: 2018-07-17 | Disposition: A | Discharge status: Home / Self Care | Attending: Emergency Medicine | Admitting: Emergency Medicine

## 2018-07-17 ENCOUNTER — Emergency Department (HOSPITAL_COMMUNITY)

## 2018-07-17 DIAGNOSIS — I10 Essential (primary) hypertension: Secondary | ICD-10-CM | POA: Insufficient documentation

## 2018-07-17 DIAGNOSIS — R0789 Other chest pain: Secondary | ICD-10-CM | POA: Insufficient documentation

## 2018-07-17 DIAGNOSIS — Z87891 Personal history of nicotine dependence: Secondary | ICD-10-CM | POA: Insufficient documentation

## 2018-07-17 DIAGNOSIS — R079 Chest pain, unspecified: Secondary | ICD-10-CM

## 2018-07-17 LAB — BASIC METABOLIC PANEL
Anion gap: 9 (ref 5–15)
BUN: 6 mg/dL (ref 6–20)
CO2: 27 mmol/L (ref 22–32)
Calcium: 9.5 mg/dL (ref 8.9–10.3)
Chloride: 104 mmol/L (ref 98–111)
Creatinine, Ser: 1.04 mg/dL (ref 0.61–1.24)
GFR calc Af Amer: >60 mL/min (ref >60)
GFR calc non Af Amer: >60 mL/min (ref >60)
Glucose, Bld: 96 mg/dL (ref 70–99)
Potassium: 3.7 mmol/L (ref 3.5–5.1)
Sodium: 140 mmol/L (ref 135–145)

## 2018-07-17 LAB — CBC WITH DIFFERENTIAL/PLATELET
Abs Immature Granulocytes: 0.02 10*3/uL (ref 0.00–0.07)
Basophils Absolute: 0 10*3/uL (ref 0.0–0.1)
Basophils Relative: 0 %
Eosinophils Absolute: 0.1 10*3/uL (ref 0.0–0.5)
Eosinophils Relative: 1 %
HCT: 43.9 % (ref 39.0–52.0)
Hemoglobin: 14.2 g/dL (ref 13.0–17.0)
Immature Granulocytes: 0 %
Lymphocytes Relative: 43 %
Lymphs Abs: 2.4 10*3/uL (ref 0.7–4.0)
MCH: 26.3 pg (ref 26.0–34.0)
MCHC: 32.3 g/dL (ref 30.0–36.0)
MCV: 81.4 fL (ref 80.0–100.0)
Monocytes Absolute: 0.4 10*3/uL (ref 0.1–1.0)
Monocytes Relative: 8 %
Neutro Abs: 2.7 10*3/uL (ref 1.7–7.7)
Neutrophils Relative %: 48 %
Platelets: 234 10*3/uL (ref 150–400)
RBC: 5.39 MIL/uL (ref 4.22–5.81)
RDW: 13.2 % (ref 11.5–15.5)
WBC: 5.6 10*3/uL (ref 4.0–10.5)
nRBC: 0 % (ref 0.0–0.2)

## 2018-07-17 LAB — TROPONIN I (HIGH SENSITIVITY): Troponin I (High Sensitivity): <2 ng/L (ref <18)

## 2018-07-17 NOTE — ED Triage Notes (Signed)
Pt saw PCP and c/o chest pain that has now subsided. He said cath lab EKG looks same. Sinus arrythmia. Aspirin 324mg .

## 2018-07-17 NOTE — ED Provider Notes (Addendum)
[facility] EMERGENCY DEPARTMENT Provider Note   CSN: [id number] Arrival date & time:       History   Chief Complaint Chief Complaint  Patient presents with  . Chest Pain    HPI Dan Chase is a 23 y.o. male.     Chest pain described as a tightness earlier today.  No dyspnea, diaphoresis, nausea.  No prolonged travel or immobilization.  He was seen by his primary care doctor and "an abnormal EKG" was noted.  Patient sent to the ED.  Past medical history hypertension for which she is taking medication.  Severity is mild.  Nothing makes symptoms better or worse.     Past Medical History:  Diagnosis Date  . HTN (hypertension)   . Pericarditis    Possible    Patient Active Problem List   Diagnosis Date Noted  . HTN (hypertension)   . Pericarditis     Past Surgical History:  Procedure Laterality Date  . None    . WISDOM TOOTH EXTRACTION  03/2013        Home Medications    Prior to Admission medications   Medication Sig Start Date End Date Taking? Authorizing Provider  chlorthalidone (HYGROTON) 25 MG tablet Take 1 tablet (25 mg total) by mouth daily. 04/18/15  Yes Minus Breeding, MD  lisinopril (ZESTRIL) 10 MG tablet Take 10 mg by mouth daily.   Yes [provider]  venlafaxine XR (EFFEXOR-XR) 37.5 MG 24 hr capsule Take 37.5 mg by mouth daily.   Yes [provider]    Family History Family History  Problem Relation Age of Onset  . Stroke Mother 28  . Hypertension Maternal Grandmother   . Diabetes Maternal Grandmother   . Yves Dill Parkinson White syndrome Sister     Social History Social History   Tobacco Use  . Smoking status: Former Research scientist (life sciences)  . Smokeless tobacco: Never Used  Substance Use Topics  . Alcohol use: Yes  . Drug use: No     Allergies   Shellfish allergy and Iodine   Review of Systems Review of Systems  All other systems reviewed and are negative.    Physical Exam Updated Vital Signs BP (!)  141/99   Pulse 74   Temp 98.7 F (37.1 C) (Oral)   Resp 16   SpO2 100%   Physical Exam Vitals signs and nursing note reviewed.  Constitutional:      Appearance: He is well-developed.  HENT:     Head: Normocephalic and atraumatic.  Eyes:     Conjunctiva/sclera: Conjunctivae normal.  Neck:     Musculoskeletal: Neck supple.  Cardiovascular:     Rate and Rhythm: Normal rate and regular rhythm.  Pulmonary:     Effort: Pulmonary effort is normal.     Breath sounds: Normal breath sounds.  Abdominal:     General: Bowel sounds are normal.     Palpations: Abdomen is soft.  Musculoskeletal: Normal range of motion.  Skin:    General: Skin is warm and dry.  Neurological:     Mental Status: He is alert and oriented to person, place, and time.  Psychiatric:        Behavior: Behavior normal.      ED Treatments / Results  Labs (all labs ordered are listed, but only abnormal results are displayed) Labs Reviewed  CBC WITH DIFFERENTIAL/PLATELET  BASIC METABOLIC PANEL  TROPONIN I (HIGH SENSITIVITY)  TROPONIN I (HIGH SENSITIVITY)    EKG EKG Interpretation  Date/Time:  Monday July 17 2018 12:34:49 EDT Ventricular Rate:  88 PR Interval:    QRS Duration: 116 QT Interval:  379 QTC Calculation: 459 R Axis:   68 Text Interpretation:  Sinus rhythm Incomplete right bundle branch block Anterior infarct, acute (LAD) ST elevation, consider inferior injury >>> Acute MI <<< Confirmed by Donnetta Hutchingook, Stepanie Graver ([id number]) on 07/17/2018 1:07:38 PM   Radiology Dg Chest Port 1 View  Result Date: 07/17/2018 CLINICAL DATA:  Chest pain, irregular heart rate. EXAM: PORTABLE CHEST 1 VIEW COMPARISON:  Radiographs of March 06, 2015. FINDINGS: The heart size and mediastinal contours are within normal limits. Both lungs are clear. No pneumothorax or pleural effusion is noted. The visualized skeletal structures are unremarkable. IMPRESSION: No active disease. Electronically Signed   By: Lupita RaiderJames  Miceli Jr M.D.   On:  07/17/2018 13:17    Procedures Procedures (including critical care time)  Medications Ordered in ED Medications - No data to display   Initial Impression / Assessment and Plan / ED Course  I have reviewed the triage vital signs and the nursing notes.  Pertinent labs & imaging results that were available during my care of the patient were reviewed by me and considered in my medical decision making (see chart for details).        Patient presents with abnormal EKG.  EKG looks similar to previous EKGs.  Basic cardiac work-up pending.  Patient is low risk for ACS or PE.    EKG compared to previous EKGs.  Today's EKG is similar.  Work-up including delta troponin negative.  Patient stable for discharge.  Final Clinical Impressions(s) / ED Diagnoses   Final diagnoses:  Chest pain, unspecified type    ED Discharge Orders    None       Donnetta Hutchingook, Tony Granquist, MD 07/17/18 1359    Donnetta Hutchingook, Khylin Gutridge, MD 07/17/18 [phone]

## 2018-07-17 NOTE — Discharge Instructions (Addendum)
EKG is similar to your EKG in the past.  No evidence of a heart attack.  Follow-up with your primary care doctor.
